# Patient Record
Sex: Female | Born: 1970
Health system: Southern US, Community
[De-identification: ages and names within clinical notes are randomized; demographics above are authoritative.]

## PROBLEM LIST (undated history)

## (undated) ENCOUNTER — Emergency Department (HOSPITAL_COMMUNITY): Discharge status: Absent without leave | Payer: Self-pay

---

## 2006-03-15 HISTORY — PX: BREAST EXCISIONAL BIOPSY: SUR124

## 2008-07-19 ENCOUNTER — Ambulatory Visit: Payer: Self-pay | Admitting: Internal Medicine

## 2008-07-19 ENCOUNTER — Emergency Department: Payer: Self-pay | Admitting: Unknown Physician Specialty

## 2009-05-26 ENCOUNTER — Ambulatory Visit: Payer: Self-pay | Admitting: Family Medicine

## 2009-06-13 ENCOUNTER — Ambulatory Visit: Payer: Self-pay | Admitting: Family Medicine

## 2009-07-08 ENCOUNTER — Ambulatory Visit: Payer: Self-pay | Admitting: Surgery

## 2009-07-09 ENCOUNTER — Ambulatory Visit: Payer: Self-pay | Admitting: Surgery

## 2009-07-10 ENCOUNTER — Ambulatory Visit: Payer: Self-pay | Admitting: Surgery

## 2009-12-04 ENCOUNTER — Emergency Department: Payer: Self-pay | Admitting: Emergency Medicine

## 2010-03-11 ENCOUNTER — Ambulatory Visit: Payer: Self-pay | Admitting: Unknown Physician Specialty

## 2010-04-02 ENCOUNTER — Ambulatory Visit: Payer: Self-pay | Admitting: Unknown Physician Specialty

## 2010-04-03 ENCOUNTER — Ambulatory Visit: Payer: Self-pay | Admitting: Unknown Physician Specialty

## 2010-04-06 LAB — PATHOLOGY REPORT

## 2011-03-15 ENCOUNTER — Ambulatory Visit: Payer: Self-pay | Admitting: Internal Medicine

## 2012-02-07 ENCOUNTER — Ambulatory Visit: Payer: Self-pay | Admitting: Family Medicine

## 2012-03-01 ENCOUNTER — Ambulatory Visit: Payer: Self-pay | Admitting: Surgery

## 2015-02-28 ENCOUNTER — Other Ambulatory Visit: Payer: Self-pay | Admitting: Family Medicine

## 2015-02-28 DIAGNOSIS — N644 Mastodynia: Secondary | ICD-10-CM

## 2015-03-27 ENCOUNTER — Ambulatory Visit
Admission: RE | Admit: 2015-03-27 | Discharge: 2015-03-27 | Disposition: A | Discharge status: Home / Self Care | Payer: Commercial Managed Care - HMO | Source: Ambulatory Visit | Attending: Family Medicine | Admitting: Family Medicine

## 2015-03-27 DIAGNOSIS — N644 Mastodynia: Secondary | ICD-10-CM

## 2015-04-28 ENCOUNTER — Ambulatory Visit: Payer: Self-pay | Admitting: Physician Assistant

## 2015-04-28 ENCOUNTER — Encounter: Payer: Self-pay | Admitting: Physician Assistant

## 2015-04-28 VITALS — BP 120/80 | HR 78 | Temp 98.8°F

## 2015-04-28 DIAGNOSIS — J029 Acute pharyngitis, unspecified: Secondary | ICD-10-CM

## 2015-04-28 MED ORDER — OMEPRAZOLE 40 MG PO CPDR: 40.0000 mg | DELAYED_RELEASE_CAPSULE | Freq: Every day | ORAL | Status: DC

## 2015-04-28 MED ORDER — AMOXICILLIN 875 MG PO TABS: 875.0000 mg | ORAL_TABLET | Freq: Two times a day (BID) | ORAL | Status: DC

## 2015-04-28 NOTE — Progress Notes (Signed)
S: c/o sore throat for 7 days, no fever/chills, no cough or congestion, some fatigue, children haven't been sick, takes prilosec for reflux and sleeps on her left side, most of the pain is on the left side, also states her breath is bad  O: vitals wnl, nad, tms clear, nasal mucosa wnl, throat with red area posteriorly almost like a burn, area is raised but no exduate noted, neck supple no lymph, lungs c t a, cv rrr  A: acute pharyngitis, ?burn secondary to reflux vs other etiology  P: amoxil 838m bid x 10d, prilosec 44mqd, if not improving by the end of the week will refer to ENT or patient can also make her own appointment if desired

## 2016-04-19 DIAGNOSIS — L239 Allergic contact dermatitis, unspecified cause: Secondary | ICD-10-CM | POA: Diagnosis not present

## 2016-05-26 ENCOUNTER — Other Ambulatory Visit: Payer: Self-pay | Admitting: Internal Medicine

## 2016-05-26 DIAGNOSIS — Z1231 Encounter for screening mammogram for malignant neoplasm of breast: Secondary | ICD-10-CM

## 2016-06-25 ENCOUNTER — Ambulatory Visit
Admission: RE | Admit: 2016-06-25 | Discharge: 2016-06-25 | Disposition: A | Discharge status: Home / Self Care | Payer: Commercial Managed Care - HMO | Source: Ambulatory Visit | Attending: Internal Medicine | Admitting: Internal Medicine

## 2016-06-25 DIAGNOSIS — Z1231 Encounter for screening mammogram for malignant neoplasm of breast: Secondary | ICD-10-CM | POA: Diagnosis not present

## 2016-07-01 ENCOUNTER — Other Ambulatory Visit: Payer: Self-pay | Admitting: Internal Medicine

## 2016-07-01 DIAGNOSIS — R928 Other abnormal and inconclusive findings on diagnostic imaging of breast: Secondary | ICD-10-CM

## 2016-07-01 DIAGNOSIS — N6489 Other specified disorders of breast: Secondary | ICD-10-CM

## 2016-07-08 ENCOUNTER — Ambulatory Visit
Admission: RE | Admit: 2016-07-08 | Discharge: 2016-07-08 | Disposition: A | Discharge status: Home / Self Care | Payer: Commercial Managed Care - HMO | Source: Ambulatory Visit | Attending: Internal Medicine | Admitting: Internal Medicine

## 2016-07-08 DIAGNOSIS — N6323 Unspecified lump in the left breast, lower outer quadrant: Secondary | ICD-10-CM | POA: Diagnosis not present

## 2016-07-08 DIAGNOSIS — N6314 Unspecified lump in the right breast, lower inner quadrant: Secondary | ICD-10-CM | POA: Diagnosis not present

## 2016-07-08 DIAGNOSIS — R928 Other abnormal and inconclusive findings on diagnostic imaging of breast: Secondary | ICD-10-CM

## 2016-07-08 DIAGNOSIS — N6489 Other specified disorders of breast: Secondary | ICD-10-CM | POA: Diagnosis present

## 2016-07-08 DIAGNOSIS — N6324 Unspecified lump in the left breast, lower inner quadrant: Secondary | ICD-10-CM | POA: Diagnosis not present

## 2016-07-09 ENCOUNTER — Other Ambulatory Visit: Payer: Self-pay | Admitting: Internal Medicine

## 2016-07-09 DIAGNOSIS — N6489 Other specified disorders of breast: Secondary | ICD-10-CM

## 2016-07-21 DIAGNOSIS — L299 Pruritus, unspecified: Secondary | ICD-10-CM | POA: Diagnosis not present

## 2016-07-21 DIAGNOSIS — B850 Pediculosis due to Pediculus humanus capitis: Secondary | ICD-10-CM | POA: Diagnosis not present

## 2016-08-16 ENCOUNTER — Encounter: Payer: Self-pay | Admitting: Physician Assistant

## 2016-08-16 ENCOUNTER — Ambulatory Visit: Payer: Self-pay | Admitting: Physician Assistant

## 2016-08-16 VITALS — BP 100/74 | HR 85 | Temp 98.6°F

## 2016-08-16 DIAGNOSIS — A084 Viral intestinal infection, unspecified: Secondary | ICD-10-CM

## 2016-08-16 MED ORDER — ONDANSETRON 4 MG PO TBDP: 4.0000 mg | ORAL_TABLET | Freq: Three times a day (TID) | ORAL | 0 refills | Status: DC | PRN

## 2016-08-16 NOTE — Progress Notes (Signed)
S:  Pt c/o vomiting and diarrhea, sx for 1 day, no fever/chills, + abd pain with  cramping and diarrhea; no pain with walking,  denies cp/sob, denies camping, bad food, recent antibiotics, or exposure to bad water Remainder ros neg  O:  Vitals wnl, nad, ENT wnl, neck supple no lymph, lungs c t a, cv rrr, abd soft mildly tender in midepigastric and rlq,  bs increased lower quads b/l, neuro intact  A:  Viral gastroenteritis  P:  Reassurance, fluids, brat diet, immodium ad for diarrhea if needed, rx zofran, return if not better in 3 days, return earlier if worsening, if rlq pain is increasing then go to ER for eval

## 2016-09-07 ENCOUNTER — Encounter: Payer: Self-pay | Admitting: Physician Assistant

## 2016-09-07 ENCOUNTER — Ambulatory Visit: Payer: Self-pay | Admitting: Physician Assistant

## 2016-09-07 VITALS — BP 110/74 | HR 73 | Temp 98.9°F

## 2016-09-07 DIAGNOSIS — H6982 Other specified disorders of Eustachian tube, left ear: Secondary | ICD-10-CM

## 2016-09-07 MED ORDER — FLUTICASONE PROPIONATE 50 MCG/ACT NA SUSP: 2.0000 | Freq: Every day | NASAL | 6 refills | Status: AC

## 2016-09-07 NOTE — Progress Notes (Signed)
S:  C/o ears popping and being stopped up, no drainage from ears, some dizziness, had an episode of vertigo the other day, gets dizzy if she moves her head too quickly; no fever/chills, no cough or congestion, some sinus pressure, remainder ros neg Using otc meds without relief  O:  Vitals wnl, nad, tms dull b/l, nasal mucosa swollen and boggy, throat wnl, neck supple no lymph, lungs c t a, cv rrr, neuro intact  A: acute eustachean tube dysfunction  P: flonase, sudafed, return if not improving in 3 to 5 days, return earlier if worsening

## 2017-01-04 DIAGNOSIS — L718 Other rosacea: Secondary | ICD-10-CM | POA: Diagnosis not present

## 2017-07-27 ENCOUNTER — Other Ambulatory Visit: Payer: Self-pay | Admitting: Internal Medicine

## 2017-07-27 DIAGNOSIS — Z1239 Encounter for other screening for malignant neoplasm of breast: Secondary | ICD-10-CM

## 2017-07-27 DIAGNOSIS — N6310 Unspecified lump in the right breast, unspecified quadrant: Secondary | ICD-10-CM

## 2017-08-19 DIAGNOSIS — N926 Irregular menstruation, unspecified: Secondary | ICD-10-CM | POA: Diagnosis not present

## 2017-08-19 DIAGNOSIS — Z79899 Other long term (current) drug therapy: Secondary | ICD-10-CM | POA: Diagnosis not present

## 2017-08-19 DIAGNOSIS — Z13220 Encounter for screening for lipoid disorders: Secondary | ICD-10-CM | POA: Diagnosis not present

## 2017-09-02 DIAGNOSIS — K219 Gastro-esophageal reflux disease without esophagitis: Secondary | ICD-10-CM | POA: Diagnosis not present

## 2017-09-02 DIAGNOSIS — Z23 Encounter for immunization: Secondary | ICD-10-CM | POA: Diagnosis not present

## 2017-09-02 DIAGNOSIS — Z0000 Encounter for general adult medical examination without abnormal findings: Secondary | ICD-10-CM | POA: Diagnosis not present

## 2017-09-05 ENCOUNTER — Other Ambulatory Visit: Payer: Self-pay | Admitting: Internal Medicine

## 2017-09-05 DIAGNOSIS — R42 Dizziness and giddiness: Secondary | ICD-10-CM

## 2017-09-14 DIAGNOSIS — N912 Amenorrhea, unspecified: Secondary | ICD-10-CM | POA: Diagnosis not present

## 2017-09-14 DIAGNOSIS — Z124 Encounter for screening for malignant neoplasm of cervix: Secondary | ICD-10-CM | POA: Diagnosis not present

## 2017-09-14 DIAGNOSIS — N3281 Overactive bladder: Secondary | ICD-10-CM | POA: Diagnosis not present

## 2017-09-14 DIAGNOSIS — R790 Abnormal level of blood mineral: Secondary | ICD-10-CM | POA: Diagnosis not present

## 2017-09-16 ENCOUNTER — Ambulatory Visit
Admission: RE | Admit: 2017-09-16 | Discharge: 2017-09-16 | Disposition: A | Discharge status: Home / Self Care | Payer: 59 | Source: Ambulatory Visit | Attending: Internal Medicine | Admitting: Internal Medicine

## 2017-09-16 DIAGNOSIS — R42 Dizziness and giddiness: Secondary | ICD-10-CM | POA: Diagnosis not present

## 2017-09-28 DIAGNOSIS — Z1211 Encounter for screening for malignant neoplasm of colon: Secondary | ICD-10-CM | POA: Diagnosis not present

## 2017-10-04 DIAGNOSIS — N912 Amenorrhea, unspecified: Secondary | ICD-10-CM | POA: Diagnosis not present

## 2017-10-04 DIAGNOSIS — N3281 Overactive bladder: Secondary | ICD-10-CM | POA: Diagnosis not present

## 2017-10-12 DIAGNOSIS — N912 Amenorrhea, unspecified: Secondary | ICD-10-CM | POA: Diagnosis not present

## 2017-12-08 ENCOUNTER — Ambulatory Visit
Admission: RE | Admit: 2017-12-08 | Discharge: 2017-12-08 | Disposition: A | Discharge status: Home / Self Care | Payer: 59 | Source: Ambulatory Visit | Attending: Internal Medicine | Admitting: Internal Medicine

## 2017-12-08 DIAGNOSIS — Z1239 Encounter for other screening for malignant neoplasm of breast: Secondary | ICD-10-CM

## 2017-12-08 DIAGNOSIS — N6310 Unspecified lump in the right breast, unspecified quadrant: Secondary | ICD-10-CM | POA: Diagnosis not present

## 2017-12-08 DIAGNOSIS — R928 Other abnormal and inconclusive findings on diagnostic imaging of breast: Secondary | ICD-10-CM | POA: Diagnosis not present

## 2017-12-08 DIAGNOSIS — Z1231 Encounter for screening mammogram for malignant neoplasm of breast: Secondary | ICD-10-CM | POA: Insufficient documentation

## 2018-11-13 ENCOUNTER — Other Ambulatory Visit: Payer: Self-pay | Admitting: Internal Medicine

## 2018-11-13 DIAGNOSIS — Z1239 Encounter for other screening for malignant neoplasm of breast: Secondary | ICD-10-CM

## 2018-11-13 DIAGNOSIS — N6489 Other specified disorders of breast: Secondary | ICD-10-CM

## 2018-11-16 ENCOUNTER — Ambulatory Visit
Admission: RE | Admit: 2018-11-16 | Discharge: 2018-11-16 | Disposition: A | Discharge status: Home / Self Care | Payer: 59 | Source: Ambulatory Visit | Attending: Family Medicine | Admitting: Family Medicine

## 2018-11-16 ENCOUNTER — Other Ambulatory Visit: Payer: Self-pay

## 2018-11-16 ENCOUNTER — Other Ambulatory Visit: Payer: Self-pay | Admitting: Family Medicine

## 2018-11-16 DIAGNOSIS — R1011 Right upper quadrant pain: Secondary | ICD-10-CM

## 2018-11-16 DIAGNOSIS — R110 Nausea: Secondary | ICD-10-CM

## 2018-11-23 ENCOUNTER — Ambulatory Visit: Payer: 59

## 2018-12-14 ENCOUNTER — Ambulatory Visit
Admission: RE | Admit: 2018-12-14 | Discharge: 2018-12-14 | Disposition: A | Discharge status: Home / Self Care | Payer: 59 | Source: Ambulatory Visit | Attending: Internal Medicine | Admitting: Internal Medicine

## 2018-12-14 DIAGNOSIS — N6489 Other specified disorders of breast: Secondary | ICD-10-CM

## 2018-12-14 DIAGNOSIS — Z1239 Encounter for other screening for malignant neoplasm of breast: Secondary | ICD-10-CM

## 2018-12-18 ENCOUNTER — Other Ambulatory Visit: Payer: Self-pay | Admitting: Internal Medicine

## 2018-12-18 DIAGNOSIS — R928 Other abnormal and inconclusive findings on diagnostic imaging of breast: Secondary | ICD-10-CM

## 2018-12-18 DIAGNOSIS — N6320 Unspecified lump in the left breast, unspecified quadrant: Secondary | ICD-10-CM

## 2018-12-22 ENCOUNTER — Ambulatory Visit
Admission: RE | Admit: 2018-12-22 | Discharge: 2018-12-22 | Disposition: A | Discharge status: Home / Self Care | Payer: 59 | Source: Ambulatory Visit | Attending: Internal Medicine | Admitting: Internal Medicine

## 2018-12-22 DIAGNOSIS — N6320 Unspecified lump in the left breast, unspecified quadrant: Secondary | ICD-10-CM | POA: Insufficient documentation

## 2018-12-22 DIAGNOSIS — R928 Other abnormal and inconclusive findings on diagnostic imaging of breast: Secondary | ICD-10-CM | POA: Diagnosis present

## 2018-12-22 HISTORY — PX: BREAST BIOPSY: SHX20

## 2018-12-25 LAB — SURGICAL PATHOLOGY

## 2019-04-21 IMAGING — MR MR HEAD W/O CM
8 series · 48 of 48 positions shown · non-contrast
Comparison: Head CT 07/19/2008

CLINICAL DATA: Dizziness for a few months. Amenorrhea for 7 months.

EXAM:
MRI HEAD WITHOUT CONTRAST
TECHNIQUE: Multiplanar, multiecho pulse sequences of the brain and surrounding
structures were obtained without intravenous contrast.

[Series 2: T1 · sagittal · 5.0mm · 0.45mm/px · 2 of 27 slices shown (1 of 2)]
[im 1/27]
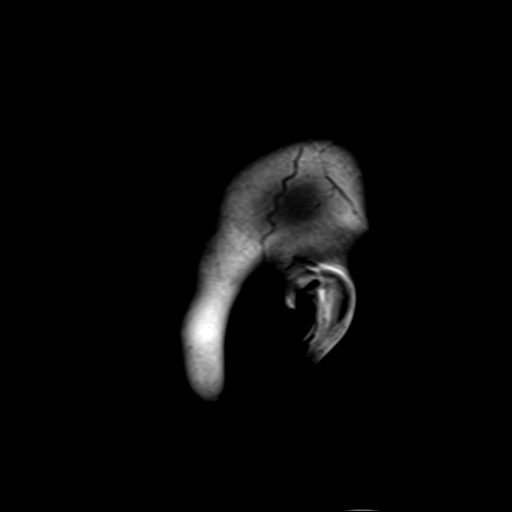
[im 27/27]
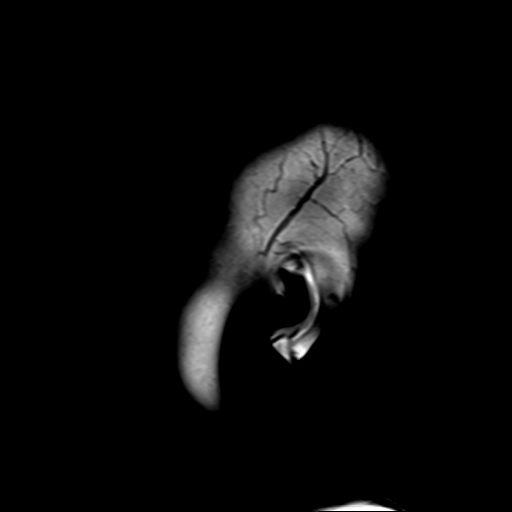

[Series 4: DWI · axial · 3.0mm · 1.80mm/px · z∈[-63,+98]mm · 6 of 55 slices shown]
[im 1/55]
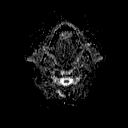
[im 11/55]
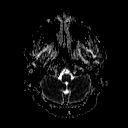
[im 22/55]
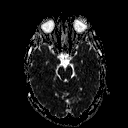
[im 33/55]
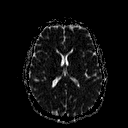
[im 44/55]
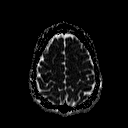
[im 55/55]
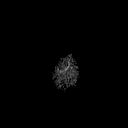

[Series 5: T2 · axial · 5.0mm · 0.60mm/px · z∈[-60,+95]mm · 3 of 25 slices shown (1 of 3)]
[im 1/25]
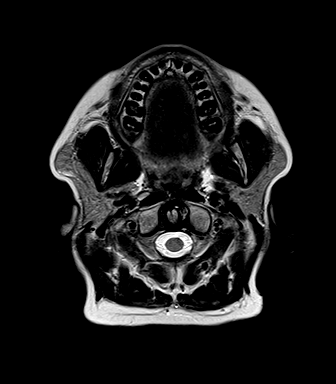
[im 13/25]
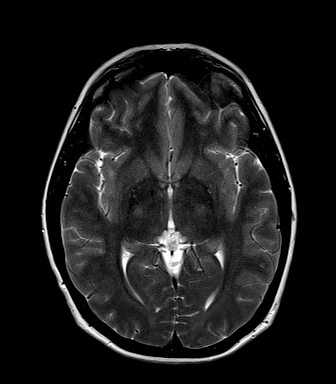
[im 25/25]
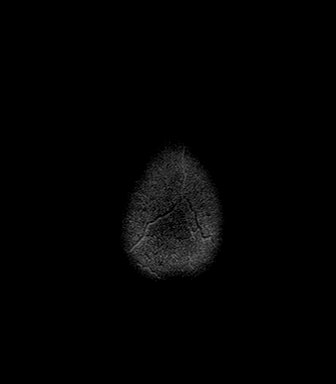

[Series 6: FLAIR · axial · 3.0mm · 0.45mm/px · z∈[-60,+95]mm · 6 of 53 slices shown]
[im 1/53]
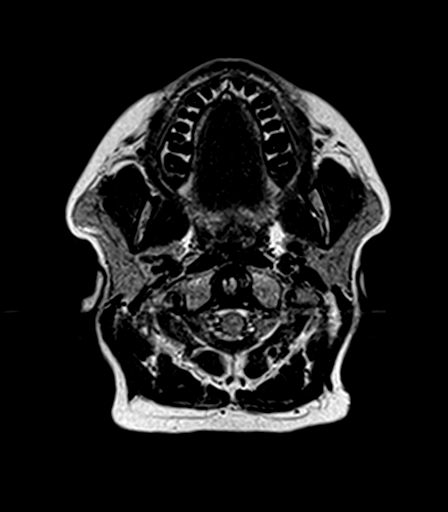
[im 11/53]
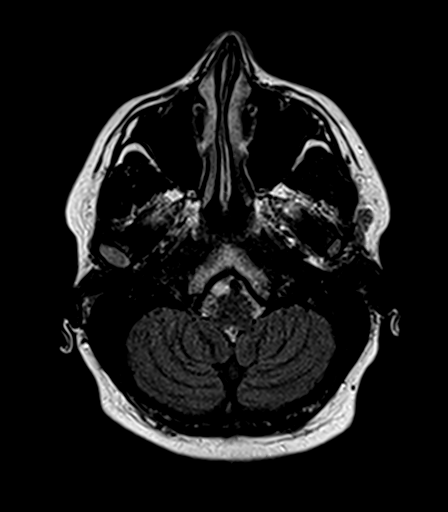
[im 21/53]
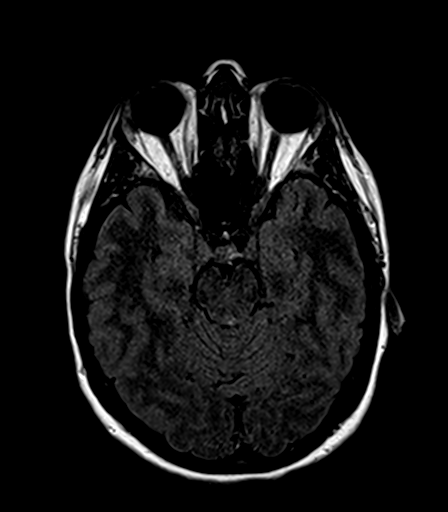
[im 32/53]
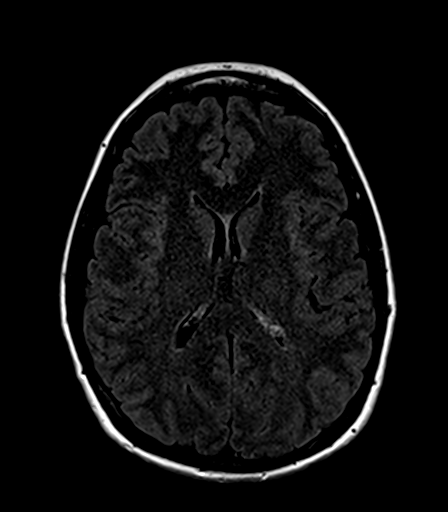
[im 42/53]
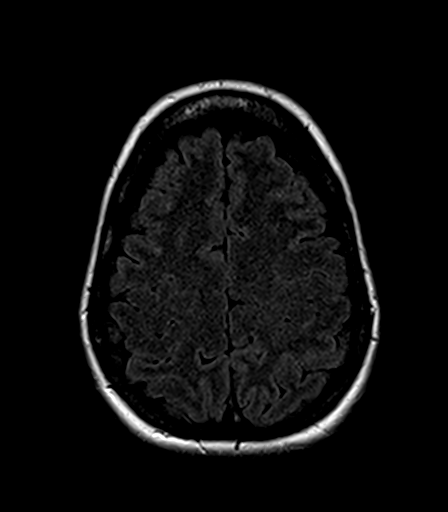
[im 53/53]
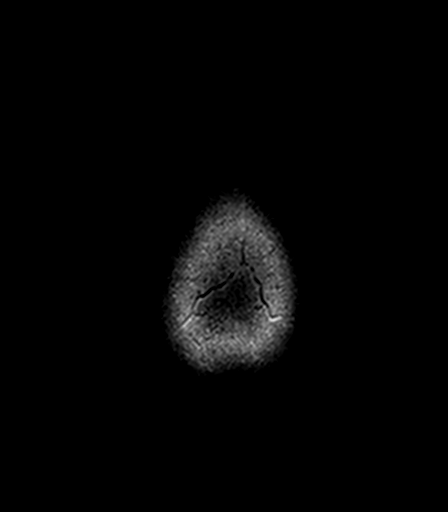

[Series 7: T2 · axial · 5.0mm · 0.45mm/px · z∈[-60,+95]mm · 3 of 25 slices shown (2 of 3)]
[im 1/25]
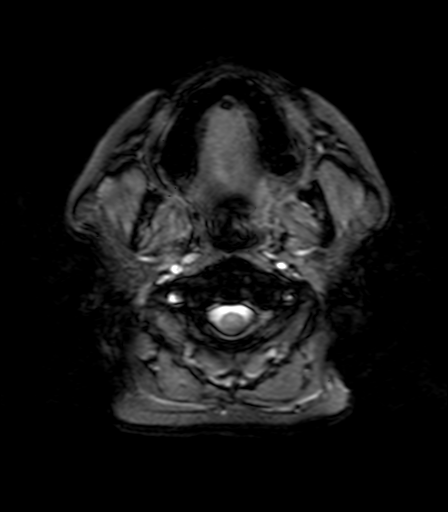
[im 13/25]
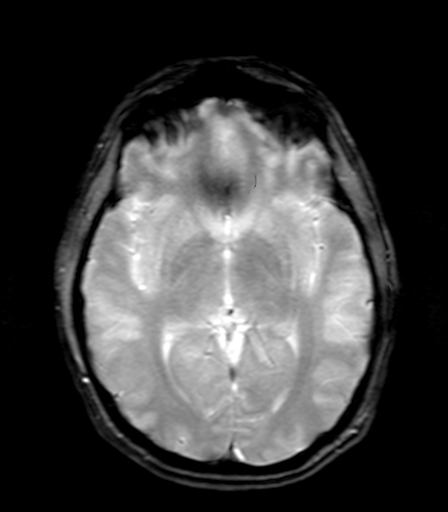
[im 25/25]
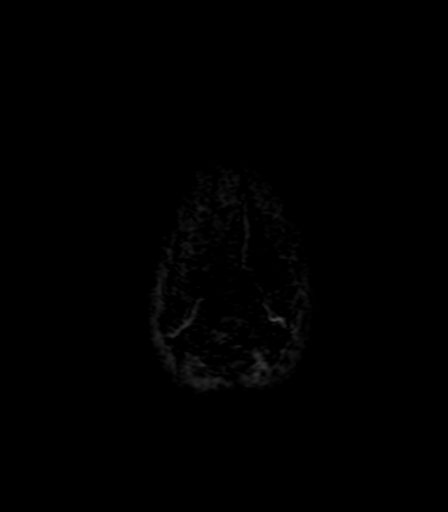

[Series 8: T1 · axial · 1.0mm · 1.00mm/px · z∈[-69,+106]mm · 19 of 176 slices shown (2 of 2)]
[im 1/176]
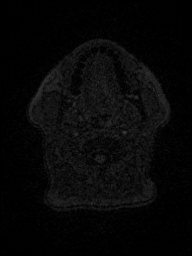
[im 10/176]
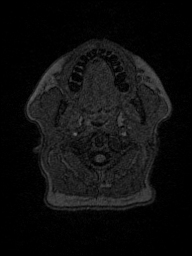
[im 20/176]
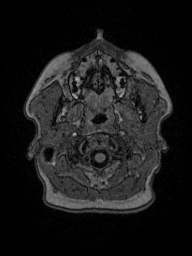
[im 30/176]
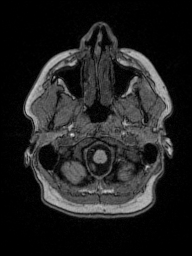
[im 39/176]
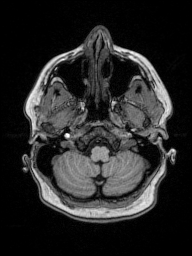
[im 49/176]
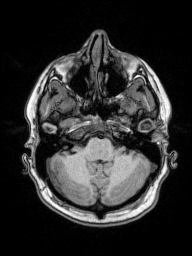
[im 59/176]
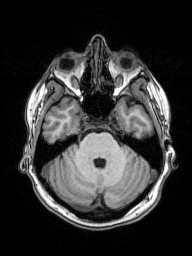
[im 69/176]
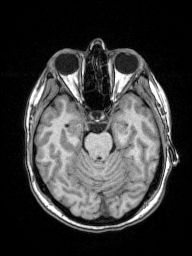
[im 78/176]
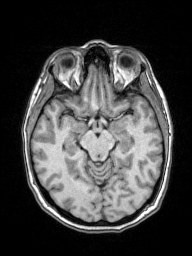
[im 88/176]
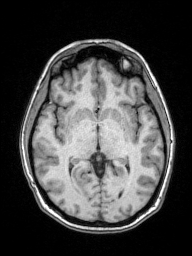
[im 98/176]
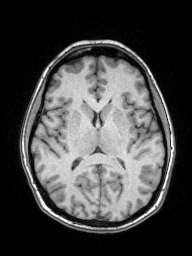
[im 107/176]
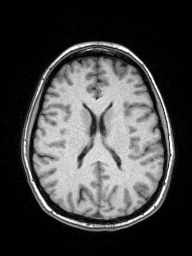
[im 117/176]
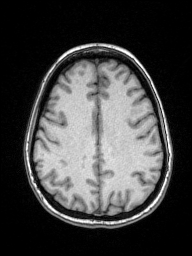
[im 127/176]
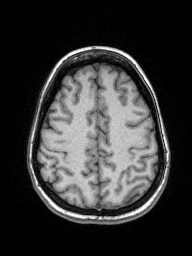
[im 137/176]
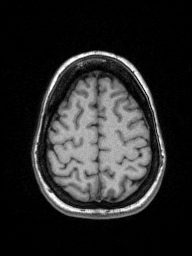
[im 146/176]
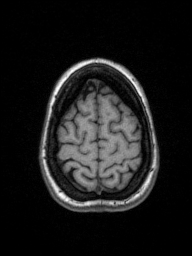
[im 156/176]
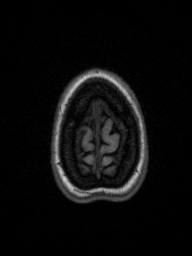
[im 166/176]
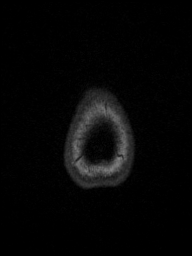
[im 176/176]
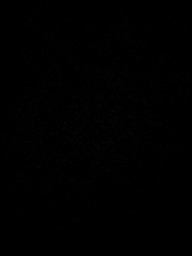

[Series 9: T2 · coronal · 5.0mm · 0.49mm/px · 3 of 29 slices shown (3 of 3)]
[im 1/29]
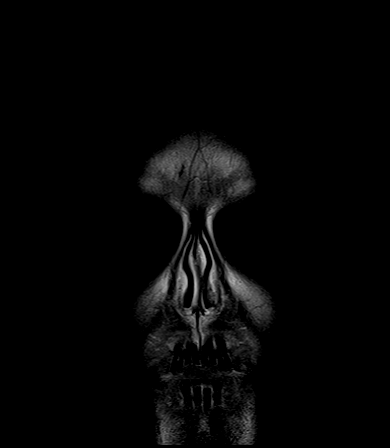
[im 15/29]
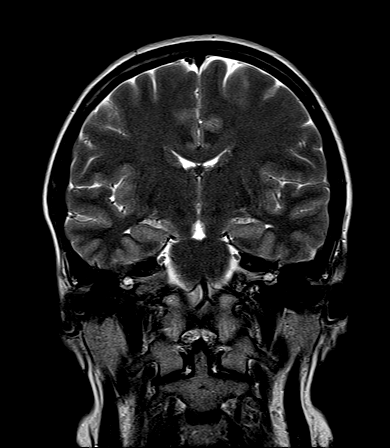
[im 29/29]
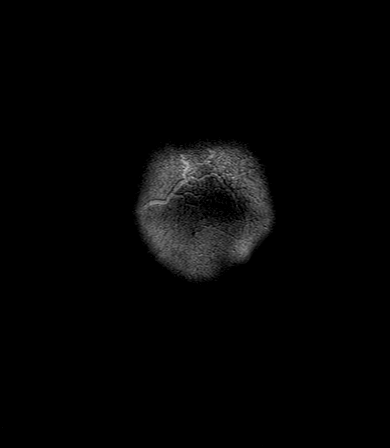

[Series 100: ax (id) · axial · 3.0mm · 1.80mm/px · z∈[-63,+98]mm · 6 of 55 slices shown]
[im 1/55]
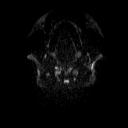
[im 11/55]
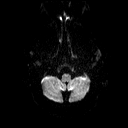
[im 22/55]
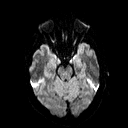
[im 33/55]
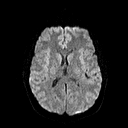
[im 44/55]
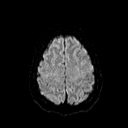
[im 55/55]
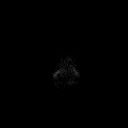

[48 of 48 positions shown; findings below may reference images not displayed]

FINDINGS: Brain: There is no evidence of acute infarct, intracranial
hemorrhage, mass, midline shift, or extra-axial fluid collection.
The ventricles and sulci are normal. There is a single 6 mm focus of
T2 hyperintensity in the left temporoparietal white matter lateral
to the atrium of the left lateral ventricle. The brainstem and
cerebellum are unremarkable.

Vascular: Major intracranial vascular flow voids are preserved.

Skull and upper cervical spine: Unremarkable bone marrow signal.

Sinuses/Orbits: Unremarkable orbits. Single opacified left ethmoid
air cell. Clear mastoid air cells.

Other: None.
IMPRESSION: 1. No acute intracranial abnormality or cause of dizziness
identified.
2. Single small focus of cerebral white matter T2 hyperintensity,
nonspecific and of doubtful clinical significance.

## 2019-05-16 ENCOUNTER — Ambulatory Visit: Payer: Self-pay | Attending: Internal Medicine

## 2019-05-16 DIAGNOSIS — Z23 Encounter for immunization: Secondary | ICD-10-CM | POA: Insufficient documentation

## 2019-05-16 NOTE — Progress Notes (Signed)
   Covid-19 Vaccination Clinic  Name:  Sue Santos    MRN: [id number] DOB: [birth year]  05/16/2019  [name] was observed post Covid-19 immunization for 15 minutes without incident. She was provided with Vaccine Information Sheet and instruction to access the V-Safe system.   [name] was instructed to call 911 with any severe reactions post vaccine: Marland Kitchen Difficulty breathing  . Swelling of face and throat  . A fast heartbeat  . A bad rash all over body  . Dizziness and weakness   Immunizations Administered    Name Date Dose VIS Date Route   Pfizer COVID-19 Vaccine 05/16/2019 12:04 PM 0.3 mL 02/23/2019 Intramuscular   Manufacturer: [place]   Lot: [id number]   [place]: [address]-6

## 2019-06-06 ENCOUNTER — Ambulatory Visit: Payer: Self-pay | Attending: Internal Medicine

## 2019-06-06 DIAGNOSIS — Z23 Encounter for immunization: Secondary | ICD-10-CM

## 2019-06-06 NOTE — Progress Notes (Signed)
   Covid-19 Vaccination Clinic  Name:  Sue Santos    MRN: [id number] DOB: 28-Jul-1970  06/06/2019  Sue Santos was observed post Covid-19 immunization for 15 minutes without incident. She was provided with Vaccine Information Sheet and instruction to access the V-Safe system.   Sue Santos was instructed to call 911 with any severe reactions post vaccine: Marland Kitchen Difficulty breathing  . Swelling of face and throat  . A fast heartbeat  . A bad rash all over body  . Dizziness and weakness   Immunizations Administered    Name Date Dose VIS Date Route   Pfizer COVID-19 Vaccine 06/06/2019  2:40 PM 0.3 mL 02/23/2019 Intramuscular   Manufacturer: [employer]   Lot: [id number]   [place]: [id number]

## 2019-07-24 ENCOUNTER — Other Ambulatory Visit: Payer: Self-pay | Admitting: Internal Medicine

## 2019-07-24 DIAGNOSIS — N6320 Unspecified lump in the left breast, unspecified quadrant: Secondary | ICD-10-CM

## 2019-08-22 ENCOUNTER — Ambulatory Visit
Admission: RE | Admit: 2019-08-22 | Discharge: 2019-08-22 | Disposition: A | Discharge status: Home / Self Care | Payer: 59 | Source: Ambulatory Visit | Attending: Internal Medicine | Admitting: Internal Medicine

## 2019-08-22 DIAGNOSIS — N6320 Unspecified lump in the left breast, unspecified quadrant: Secondary | ICD-10-CM

## 2019-08-22 DIAGNOSIS — R928 Other abnormal and inconclusive findings on diagnostic imaging of breast: Secondary | ICD-10-CM | POA: Diagnosis not present

## 2019-12-04 DIAGNOSIS — H524 Presbyopia: Secondary | ICD-10-CM | POA: Diagnosis not present

## 2019-12-10 ENCOUNTER — Other Ambulatory Visit: Payer: Self-pay | Admitting: Internal Medicine

## 2019-12-10 DIAGNOSIS — Z0000 Encounter for general adult medical examination without abnormal findings: Secondary | ICD-10-CM | POA: Diagnosis not present

## 2019-12-10 DIAGNOSIS — F3341 Major depressive disorder, recurrent, in partial remission: Secondary | ICD-10-CM | POA: Diagnosis not present

## 2019-12-10 DIAGNOSIS — K219 Gastro-esophageal reflux disease without esophagitis: Secondary | ICD-10-CM | POA: Diagnosis not present

## 2019-12-10 DIAGNOSIS — F418 Other specified anxiety disorders: Secondary | ICD-10-CM | POA: Diagnosis not present

## 2019-12-13 DIAGNOSIS — Z13220 Encounter for screening for lipoid disorders: Secondary | ICD-10-CM | POA: Diagnosis not present

## 2019-12-13 DIAGNOSIS — R1011 Right upper quadrant pain: Secondary | ICD-10-CM | POA: Diagnosis not present

## 2019-12-13 DIAGNOSIS — J029 Acute pharyngitis, unspecified: Secondary | ICD-10-CM | POA: Diagnosis not present

## 2019-12-13 DIAGNOSIS — J069 Acute upper respiratory infection, unspecified: Secondary | ICD-10-CM | POA: Diagnosis not present

## 2019-12-13 DIAGNOSIS — R110 Nausea: Secondary | ICD-10-CM | POA: Diagnosis not present

## 2019-12-13 DIAGNOSIS — N926 Irregular menstruation, unspecified: Secondary | ICD-10-CM | POA: Diagnosis not present

## 2019-12-13 DIAGNOSIS — Z79899 Other long term (current) drug therapy: Secondary | ICD-10-CM | POA: Diagnosis not present

## 2020-04-08 DIAGNOSIS — D2261 Melanocytic nevi of right upper limb, including shoulder: Secondary | ICD-10-CM | POA: Diagnosis not present

## 2020-04-08 DIAGNOSIS — L718 Other rosacea: Secondary | ICD-10-CM | POA: Diagnosis not present

## 2020-04-08 DIAGNOSIS — D225 Melanocytic nevi of trunk: Secondary | ICD-10-CM | POA: Diagnosis not present

## 2020-04-08 DIAGNOSIS — D2271 Melanocytic nevi of right lower limb, including hip: Secondary | ICD-10-CM | POA: Diagnosis not present

## 2020-04-08 DIAGNOSIS — D485 Neoplasm of uncertain behavior of skin: Secondary | ICD-10-CM | POA: Diagnosis not present

## 2020-04-08 DIAGNOSIS — D2272 Melanocytic nevi of left lower limb, including hip: Secondary | ICD-10-CM | POA: Diagnosis not present

## 2020-04-08 DIAGNOSIS — D2262 Melanocytic nevi of left upper limb, including shoulder: Secondary | ICD-10-CM | POA: Diagnosis not present

## 2020-04-08 DIAGNOSIS — L590 Erythema ab igne [dermatitis ab igne]: Secondary | ICD-10-CM | POA: Diagnosis not present

## 2020-04-08 DIAGNOSIS — L820 Inflamed seborrheic keratosis: Secondary | ICD-10-CM | POA: Diagnosis not present

## 2020-06-12 ENCOUNTER — Other Ambulatory Visit (HOSPITAL_COMMUNITY): Payer: Self-pay | Admitting: Physician Assistant

## 2020-06-19 ENCOUNTER — Other Ambulatory Visit: Payer: Self-pay | Admitting: Internal Medicine

## 2020-06-19 ENCOUNTER — Other Ambulatory Visit: Payer: Self-pay

## 2020-06-19 MED ORDER — CLONAZEPAM 0.5 MG PO TABS: ORAL_TABLET | ORAL | 5 refills | Status: DC

## 2020-06-19 MED FILL — Trazodone HCl Tab 50 MG: ORAL | 30 days supply | Qty: 30 | Fill #0 | Status: CN

## 2020-06-19 MED FILL — Doxycycline (Rosacea) Cap Delayed Release 40 MG: ORAL | 30 days supply | Qty: 30 | Fill #0 | Status: CN

## 2020-06-24 ENCOUNTER — Other Ambulatory Visit: Payer: Self-pay | Admitting: Internal Medicine

## 2020-06-24 ENCOUNTER — Other Ambulatory Visit: Payer: Self-pay

## 2020-06-24 DIAGNOSIS — J029 Acute pharyngitis, unspecified: Secondary | ICD-10-CM

## 2020-06-24 MED ORDER — CLONAZEPAM 0.5 MG PO TABS: ORAL_TABLET | ORAL | 5 refills | Status: DC

## 2020-06-24 MED ORDER — OMEPRAZOLE 40 MG PO CPDR
40.0000 mg | DELAYED_RELEASE_CAPSULE | Freq: Every day | ORAL | 3 refills | Status: DC
  Filled 2020-06-24 – 2020-07-04 (×2): qty 30, 30d supply, fill #0
  Filled 2020-08-28: qty 30, 30d supply, fill #1
  Filled 2020-11-21: qty 30, 30d supply, fill #2
  Filled 2021-01-28: qty 30, 30d supply, fill #3

## 2020-06-24 MED FILL — Doxycycline (Rosacea) Cap Delayed Release 40 MG: ORAL | 30 days supply | Qty: 30 | Fill #0 | Status: CN

## 2020-06-25 ENCOUNTER — Other Ambulatory Visit: Payer: Self-pay

## 2020-07-04 ENCOUNTER — Other Ambulatory Visit: Payer: Self-pay | Admitting: Internal Medicine

## 2020-07-04 ENCOUNTER — Other Ambulatory Visit: Payer: Self-pay

## 2020-07-04 MED ORDER — TRAZODONE HCL 50 MG PO TABS
ORAL_TABLET | ORAL | 5 refills | Status: AC
  Filled 2020-07-04 – 2021-01-28 (×2): qty 30, 30d supply, fill #0

## 2020-07-04 MED ORDER — CLONAZEPAM 0.5 MG PO TABS
ORAL_TABLET | ORAL | 5 refills | Status: DC
  Filled 2020-07-04: qty 60, 30d supply, fill #0
  Filled 2020-08-28: qty 60, 30d supply, fill #1
  Filled 2020-11-21: qty 60, 30d supply, fill #2

## 2020-07-04 MED FILL — Trazodone HCl Tab 50 MG: ORAL | 30 days supply | Qty: 30 | Fill #0 | Status: AC

## 2020-07-06 MED ORDER — CLONAZEPAM 0.5 MG PO TABS: ORAL_TABLET | ORAL | 5 refills | Status: DC

## 2020-07-25 DIAGNOSIS — Z20822 Contact with and (suspected) exposure to covid-19: Secondary | ICD-10-CM | POA: Diagnosis not present

## 2020-07-25 DIAGNOSIS — U071 COVID-19: Secondary | ICD-10-CM | POA: Diagnosis not present

## 2020-07-26 IMAGING — MG US BREAST BX W LOC DEV 1ST LESION IMG BX SPEC US GUIDE*L*
1 series · 8 of 8 positions shown · non-contrast
Comparison: Previous exam(s).
COMPARISON: Previous exam(s).

Addendum:
CLINICAL DATA: 9 mm mass in the 5 o'clock position of the left
breast at recent mammography and ultrasound.

EXAM:
ULTRASOUND GUIDED LEFT BREAST CORE NEEDLE BIOPSY

[Series 1: MG view · 0.07mm/px · 8 of 24 slices shown]
[im 1/24]
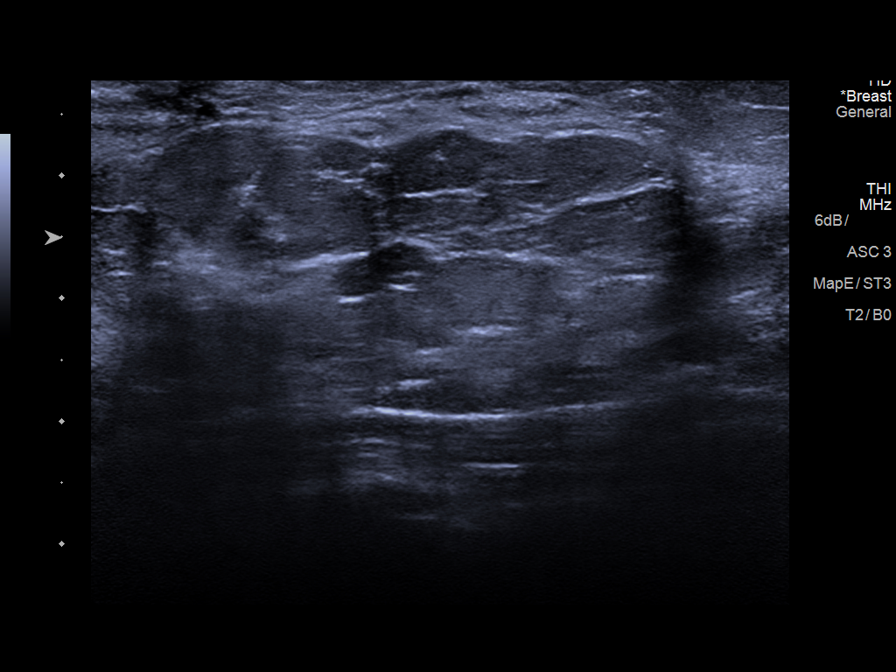
[im 4/24]
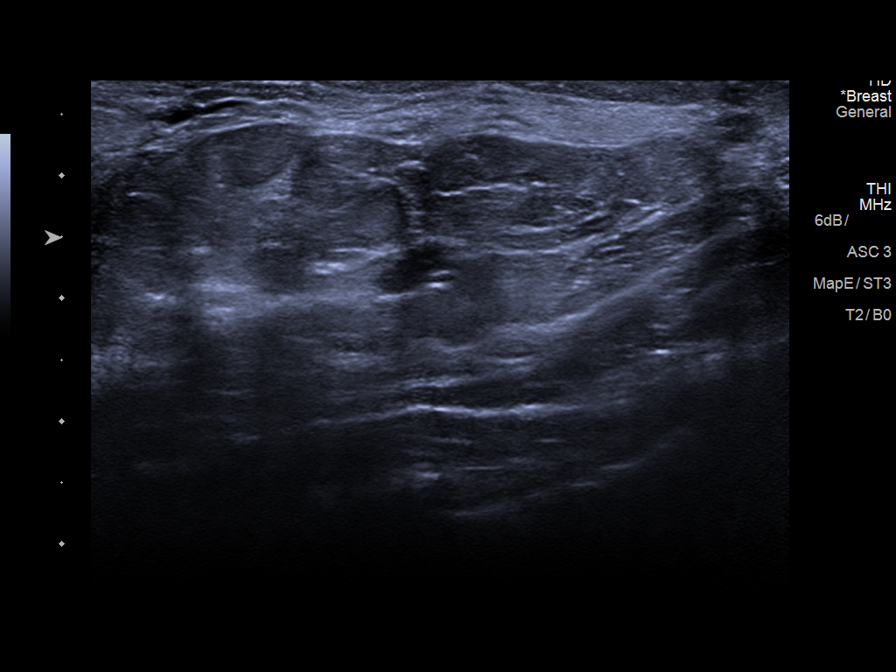
[im 7/24]
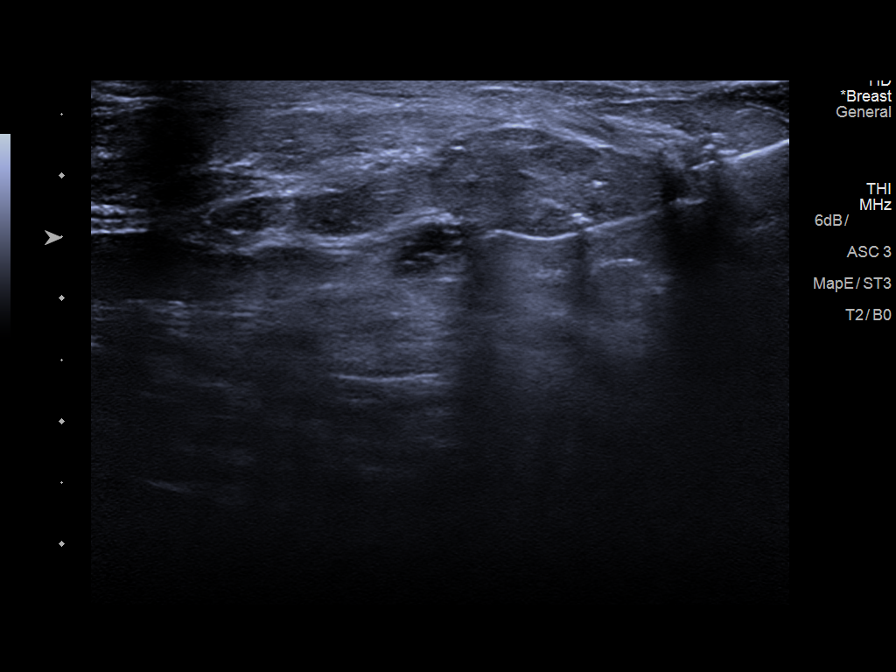
[im 10/24]
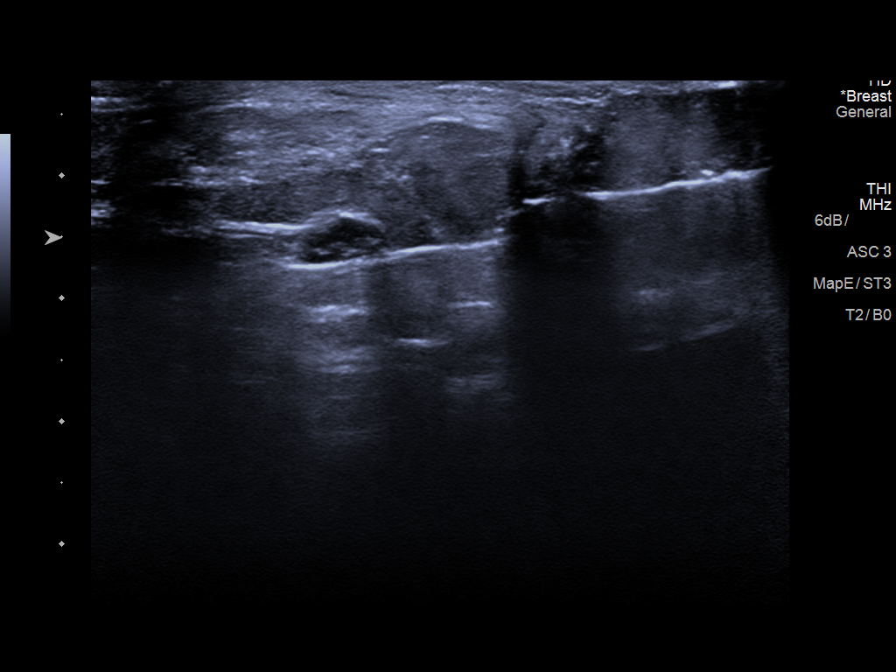
[im 14/24]
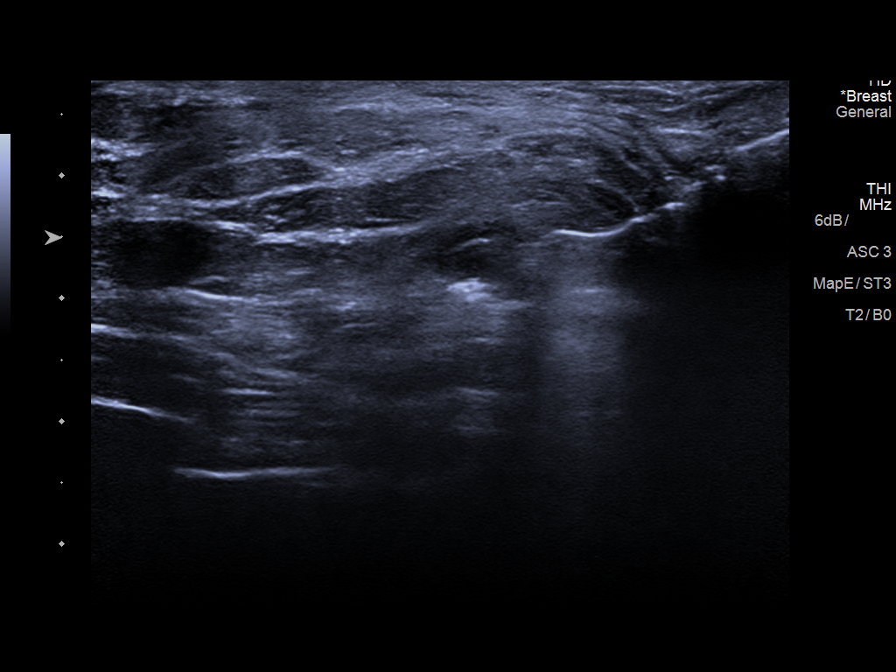
[im 17/24]
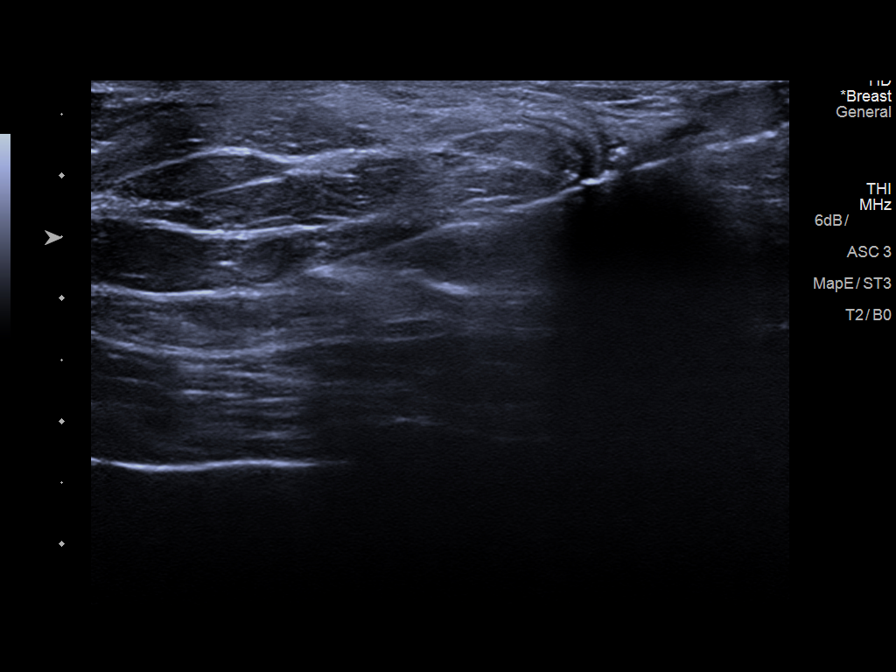
[im 20/24]
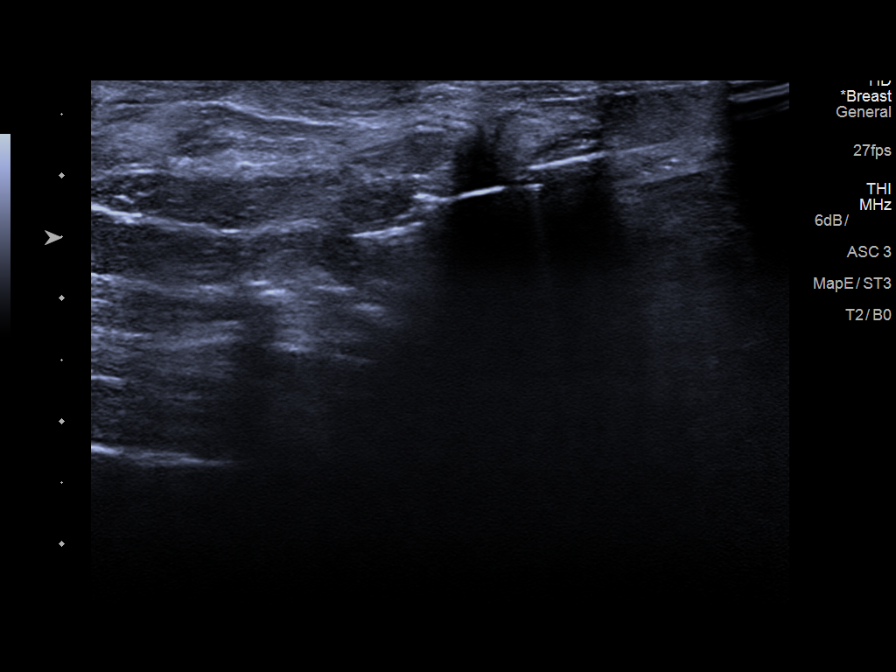
[im 24/24]
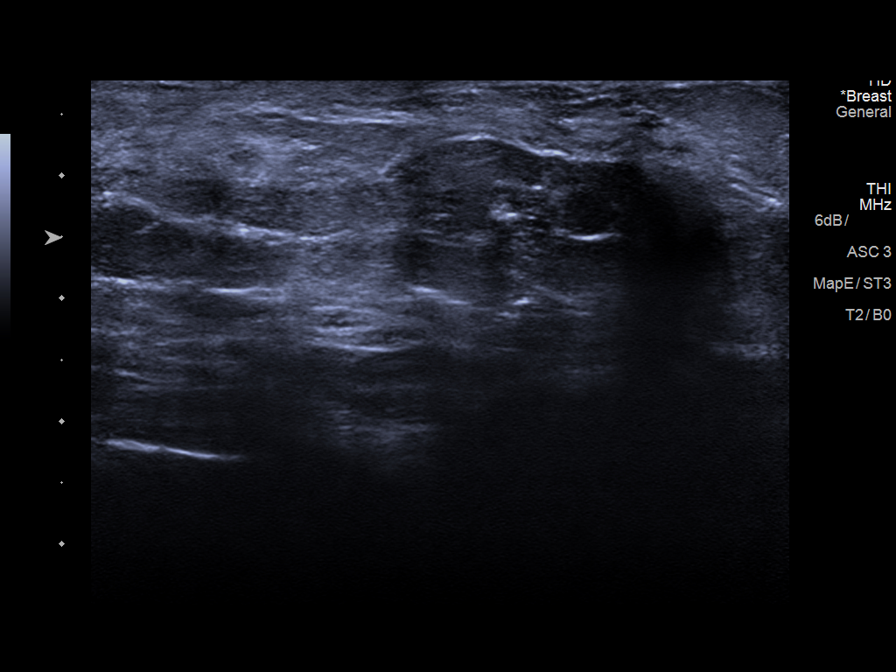

[8 of 8 positions shown; findings below may reference images not displayed]



Lesion quadrant: Lower outer quadrant

Using sterile technique and 1% Lidocaine as local anesthetic, under
direct ultrasound visualization, a 12 gauge Tuob device was
used to perform biopsy of the recently demonstrated 9 mm mass in the
5 o'clock position of the left breast using a inferolateral
approach. At the conclusion of the procedure heart shaped tissue
marker clip was deployed into the biopsy cavity. Follow up 2 view
mammogram was performed and dictated separately.
IMPRESSION: Ultrasound guided biopsy of the recently demonstrated 9 mm mass in
the 5 o'clock position of the left breast. No apparent
complications.

ADDENDUM:
PATHOLOGY revealed: A. BREAST, LEFT AT [DATE], 1 CM FROM THE NIPPLE; -
BENIGN MAMMARY PARENCHYMA WITH FIBROCYSTIC CHANGES AND APOCRINE
METAPLASIA. - NEGATIVE FOR ATYPICAL PROLIFERATIVE BREAST DISEASE.

Pathology results are CONCORDANT with imaging findings, per Dr.
Roberto Tiger.

Pathology results were discussed with patient via telephone. The
patient reported doing well after the biopsy with no adverse
symptoms, only tenderness at the site. Post biopsy care instructions
were reviewed and questions were answered. The patient was
encouraged to call [HOSPITAL] for any additional
questions or concerns.

The patient was instructed to return for LEFT diagnostic mammogram
and ultrasound in six months; informed a reminder notice would be
sent regarding this appointment.

Addendum by Satu-Maija Drufva RN on 12/26/2018.



Lesion quadrant: Lower outer quadrant

Using sterile technique and 1% Lidocaine as local anesthetic, under
direct ultrasound visualization, a 12 gauge Tuob device was
used to perform biopsy of the recently demonstrated 9 mm mass in the
5 o'clock position of the left breast using a inferolateral
approach. At the conclusion of the procedure heart shaped tissue
marker clip was deployed into the biopsy cavity. Follow up 2 view
mammogram was performed and dictated separately.
IMPRESSION: Ultrasound guided biopsy of the recently demonstrated 9 mm mass in
the 5 o'clock position of the left breast. No apparent
complications.

## 2020-08-28 ENCOUNTER — Other Ambulatory Visit: Payer: Self-pay

## 2020-08-28 MED FILL — Trazodone HCl Tab 50 MG: ORAL | 30 days supply | Qty: 30 | Fill #1 | Status: AC

## 2020-11-21 ENCOUNTER — Other Ambulatory Visit: Payer: Self-pay

## 2020-11-21 MED FILL — Trazodone HCl Tab 50 MG: ORAL | 30 days supply | Qty: 30 | Fill #2 | Status: AC

## 2021-01-21 ENCOUNTER — Other Ambulatory Visit: Payer: Self-pay | Admitting: Internal Medicine

## 2021-01-21 DIAGNOSIS — Z1231 Encounter for screening mammogram for malignant neoplasm of breast: Secondary | ICD-10-CM

## 2021-01-28 ENCOUNTER — Other Ambulatory Visit: Payer: Self-pay

## 2021-01-29 ENCOUNTER — Other Ambulatory Visit: Payer: Self-pay

## 2021-01-29 MED ORDER — CLONAZEPAM 0.5 MG PO TABS
ORAL_TABLET | ORAL | 5 refills | Status: DC
  Filled 2021-01-29: qty 60, 30d supply, fill #0

## 2021-02-01 DIAGNOSIS — J101 Influenza due to other identified influenza virus with other respiratory manifestations: Secondary | ICD-10-CM | POA: Diagnosis not present

## 2021-02-01 DIAGNOSIS — R059 Cough, unspecified: Secondary | ICD-10-CM | POA: Diagnosis not present

## 2021-03-23 ENCOUNTER — Other Ambulatory Visit: Payer: Self-pay

## 2021-03-23 ENCOUNTER — Ambulatory Visit
Admission: RE | Admit: 2021-03-23 | Discharge: 2021-03-23 | Disposition: A | Discharge status: Home / Self Care | Payer: 59 | Source: Ambulatory Visit | Attending: Internal Medicine | Admitting: Internal Medicine

## 2021-03-23 DIAGNOSIS — Z1231 Encounter for screening mammogram for malignant neoplasm of breast: Secondary | ICD-10-CM | POA: Diagnosis not present

## 2021-03-24 ENCOUNTER — Other Ambulatory Visit: Payer: Self-pay

## 2021-03-24 DIAGNOSIS — E538 Deficiency of other specified B group vitamins: Secondary | ICD-10-CM | POA: Diagnosis not present

## 2021-03-24 DIAGNOSIS — K219 Gastro-esophageal reflux disease without esophagitis: Secondary | ICD-10-CM | POA: Diagnosis not present

## 2021-03-24 DIAGNOSIS — F418 Other specified anxiety disorders: Secondary | ICD-10-CM | POA: Diagnosis not present

## 2021-03-24 DIAGNOSIS — Z0000 Encounter for general adult medical examination without abnormal findings: Secondary | ICD-10-CM | POA: Diagnosis not present

## 2021-03-24 MED ORDER — TRAZODONE HCL 50 MG PO TABS
50.0000 mg | ORAL_TABLET | Freq: Every day | ORAL | 5 refills | Status: DC
  Filled 2021-03-24: qty 30, 30d supply, fill #0
  Filled 2021-05-20: qty 30, 30d supply, fill #1
  Filled 2021-07-07: qty 30, 30d supply, fill #2
  Filled 2021-08-06: qty 30, 30d supply, fill #3
  Filled 2021-10-07: qty 30, 30d supply, fill #4
  Filled 2021-11-23: qty 30, 30d supply, fill #5

## 2021-03-24 MED ORDER — OMEPRAZOLE 40 MG PO CPDR
DELAYED_RELEASE_CAPSULE | ORAL | 3 refills | Status: DC
  Filled 2021-03-24: qty 90, 90d supply, fill #0
  Filled 2021-08-06: qty 90, 90d supply, fill #1
  Filled 2022-03-18: qty 90, 90d supply, fill #2

## 2021-03-24 MED ORDER — CLONAZEPAM 0.5 MG PO TABS
ORAL_TABLET | ORAL | 5 refills | Status: DC
  Filled 2021-03-24: qty 60, 30d supply, fill #0
  Filled 2021-05-20: qty 60, 30d supply, fill #1
  Filled 2021-08-06: qty 60, 30d supply, fill #2

## 2021-03-25 ENCOUNTER — Other Ambulatory Visit: Payer: Self-pay

## 2021-03-31 DIAGNOSIS — Z1211 Encounter for screening for malignant neoplasm of colon: Secondary | ICD-10-CM | POA: Diagnosis not present

## 2021-04-09 DIAGNOSIS — N926 Irregular menstruation, unspecified: Secondary | ICD-10-CM | POA: Diagnosis not present

## 2021-04-09 DIAGNOSIS — Z13220 Encounter for screening for lipoid disorders: Secondary | ICD-10-CM | POA: Diagnosis not present

## 2021-04-09 DIAGNOSIS — Z79899 Other long term (current) drug therapy: Secondary | ICD-10-CM | POA: Diagnosis not present

## 2021-04-09 DIAGNOSIS — E538 Deficiency of other specified B group vitamins: Secondary | ICD-10-CM | POA: Diagnosis not present

## 2021-04-09 LAB — COLOGUARD: COLOGUARD: NEGATIVE

## 2021-05-05 ENCOUNTER — Other Ambulatory Visit: Payer: Self-pay

## 2021-05-05 MED ORDER — CITALOPRAM HYDROBROMIDE 20 MG PO TABS
ORAL_TABLET | ORAL | 2 refills | Status: DC
  Filled 2021-05-05: qty 30, 30d supply, fill #0
  Filled 2021-06-05: qty 30, 30d supply, fill #1
  Filled 2021-07-07: qty 30, 30d supply, fill #2

## 2021-05-20 ENCOUNTER — Other Ambulatory Visit: Payer: Self-pay

## 2021-05-27 DIAGNOSIS — H524 Presbyopia: Secondary | ICD-10-CM | POA: Diagnosis not present

## 2021-06-05 ENCOUNTER — Other Ambulatory Visit: Payer: Self-pay

## 2021-07-07 ENCOUNTER — Other Ambulatory Visit: Payer: Self-pay

## 2021-08-06 ENCOUNTER — Other Ambulatory Visit: Payer: Self-pay

## 2021-08-07 ENCOUNTER — Other Ambulatory Visit: Payer: Self-pay

## 2021-08-07 MED ORDER — CITALOPRAM HYDROBROMIDE 20 MG PO TABS
ORAL_TABLET | ORAL | 2 refills | Status: DC
  Filled 2021-08-07: qty 30, 30d supply, fill #0
  Filled 2021-09-07: qty 30, 30d supply, fill #1
  Filled 2021-10-07: qty 30, 30d supply, fill #2

## 2021-09-07 ENCOUNTER — Other Ambulatory Visit: Payer: Self-pay

## 2021-10-07 ENCOUNTER — Other Ambulatory Visit: Payer: Self-pay

## 2021-10-07 MED ORDER — CLONAZEPAM 0.5 MG PO TABS
ORAL_TABLET | ORAL | 5 refills | Status: AC
  Filled 2021-10-07: qty 60, 30d supply, fill #0
  Filled 2021-11-23: qty 60, 30d supply, fill #1
  Filled 2022-03-18: qty 60, 30d supply, fill #2

## 2021-10-08 ENCOUNTER — Other Ambulatory Visit: Payer: Self-pay

## 2021-11-09 ENCOUNTER — Other Ambulatory Visit: Payer: Self-pay

## 2021-11-09 MED ORDER — CITALOPRAM HYDROBROMIDE 20 MG PO TABS
ORAL_TABLET | ORAL | 0 refills | Status: DC
  Filled 2021-11-09: qty 30, 30d supply, fill #0

## 2021-11-10 ENCOUNTER — Other Ambulatory Visit: Payer: Self-pay

## 2021-11-23 ENCOUNTER — Other Ambulatory Visit: Payer: Self-pay

## 2021-12-09 ENCOUNTER — Other Ambulatory Visit: Payer: Self-pay

## 2021-12-09 MED ORDER — CITALOPRAM HYDROBROMIDE 20 MG PO TABS
ORAL_TABLET | ORAL | 0 refills | Status: AC
  Filled 2021-12-09: qty 30, 30d supply, fill #0

## 2021-12-30 DIAGNOSIS — R233 Spontaneous ecchymoses: Secondary | ICD-10-CM | POA: Diagnosis not present

## 2021-12-30 DIAGNOSIS — Z79899 Other long term (current) drug therapy: Secondary | ICD-10-CM | POA: Diagnosis not present

## 2021-12-31 DIAGNOSIS — Z113 Encounter for screening for infections with a predominantly sexual mode of transmission: Secondary | ICD-10-CM | POA: Diagnosis not present

## 2021-12-31 DIAGNOSIS — R233 Spontaneous ecchymoses: Secondary | ICD-10-CM | POA: Diagnosis not present

## 2021-12-31 DIAGNOSIS — Z114 Encounter for screening for human immunodeficiency virus [HIV]: Secondary | ICD-10-CM | POA: Diagnosis not present

## 2021-12-31 DIAGNOSIS — R7989 Other specified abnormal findings of blood chemistry: Secondary | ICD-10-CM | POA: Diagnosis not present

## 2022-01-04 ENCOUNTER — Other Ambulatory Visit: Payer: Self-pay | Admitting: Physician Assistant

## 2022-01-04 DIAGNOSIS — R7989 Other specified abnormal findings of blood chemistry: Secondary | ICD-10-CM

## 2022-01-05 ENCOUNTER — Ambulatory Visit
Admission: RE | Admit: 2022-01-05 | Discharge: 2022-01-05 | Disposition: A | Discharge status: Home / Self Care | Payer: 59 | Source: Ambulatory Visit | Attending: Physician Assistant | Admitting: Physician Assistant

## 2022-01-05 DIAGNOSIS — R945 Abnormal results of liver function studies: Secondary | ICD-10-CM | POA: Diagnosis not present

## 2022-01-05 DIAGNOSIS — R233 Spontaneous ecchymoses: Secondary | ICD-10-CM | POA: Diagnosis not present

## 2022-01-05 DIAGNOSIS — R7989 Other specified abnormal findings of blood chemistry: Secondary | ICD-10-CM | POA: Diagnosis not present

## 2022-01-11 ENCOUNTER — Inpatient Hospital Stay: Payer: 59

## 2022-01-11 ENCOUNTER — Inpatient Hospital Stay: Payer: 59 | Admitting: Internal Medicine

## 2022-01-11 ENCOUNTER — Inpatient Hospital Stay: Payer: 59 | Attending: Internal Medicine | Admitting: Internal Medicine

## 2022-01-11 ENCOUNTER — Encounter: Payer: Self-pay | Admitting: Internal Medicine

## 2022-01-11 DIAGNOSIS — R7401 Elevation of levels of liver transaminase levels: Secondary | ICD-10-CM | POA: Diagnosis not present

## 2022-01-11 DIAGNOSIS — Z79899 Other long term (current) drug therapy: Secondary | ICD-10-CM | POA: Diagnosis not present

## 2022-01-11 DIAGNOSIS — E538 Deficiency of other specified B group vitamins: Secondary | ICD-10-CM | POA: Diagnosis not present

## 2022-01-11 DIAGNOSIS — Z79624 Long term (current) use of inhibitors of nucleotide synthesis: Secondary | ICD-10-CM | POA: Insufficient documentation

## 2022-01-11 DIAGNOSIS — N920 Excessive and frequent menstruation with regular cycle: Secondary | ICD-10-CM | POA: Insufficient documentation

## 2022-01-11 DIAGNOSIS — R233 Spontaneous ecchymoses: Secondary | ICD-10-CM | POA: Diagnosis not present

## 2022-01-11 NOTE — Progress Notes (Signed)
Sue Santos  [phone] [phone]  ID: Sue Santos OB: 09-23-1970  MR#: [id number]  AOZ#:[id number]  Patient Care Team: Idelle Crouch, MD as PCP - General (Internal Medicine)  REFERRING PROVIDER: Felipa Furnace, MD  REASON FOR REFERRAL: easy bruising  HPI: Sue Santos is a 51 y.o. female with past medical history of anxiety was referred to hematology for easy bruising.  Patient noted spontaneous and easy bruising on bilateral arms and legs about 3 months ago which has been progressively worsening.  She reports history of heavy menstrual period.  She had dental procedures such as bracing as a child with no bleeding issues.  She had 2 uncomplicated vaginal childbirth.  Denies postpartum hemorrhage.  She had tonsillectomy at age 35 with no issues.  She had sphincterotomy for hemorrhoidal bleed and breast biopsy without any bleeding complications.  She does not have family history from his father side. But denies any history of excessive bleeding on maternal side.  Except for bruising, she denies any other bleeding.  She otherwise has been feeling well.  She has been exercising, losing weight intentionally which has helped with her anxiety.  Takes OTC liver supplement which she has stopped.  She used to take ibuprofen 600 to 800 mg 3-4 times a week for headache but has stopped 2 months ago.  She takes whey protein and multivitamin Gummies.  Denies any other use of herbal supplements.  She was having orbital migraine for 1 year and was taking Excedrin but has significantly improved now is occasionally using it like once a month.  Medications were reviewed.  She is on Klonopin, trazodone, omeprazole, valacyclovir as needed.  She was started on citalopram about 3 months ago same time and bruising started.  REVIEW OF SYSTEMS:   ROS  As per HPI. Otherwise, a complete review of systems is negative.  PAST MEDICAL HISTORY: History reviewed.  No pertinent past medical history.  PAST SURGICAL HISTORY: Past Surgical History:  Procedure Laterality Date   BREAST BIOPSY Left 12/22/2018   [place] bx, heart clip, BENIGN MAMMARY PARENCHYMA WITH FIBROCYSTIC CHANGES AND APOCRINE METAPLASIA.   BREAST EXCISIONAL BIOPSY Right 2008    FAMILY HISTORY: Family History  Problem Relation Age of Onset   Breast cancer Neg Hx     HEALTH MAINTENANCE: Social History   Tobacco Use   Smoking status: Never   Smokeless tobacco: Never  Substance Use Topics   Alcohol use: Yes   Drug use: Never     No Known Allergies  Current Outpatient Medications  Medication Sig Dispense Refill   citalopram (CELEXA) 20 MG tablet Take 1 tablet (20 mg total) by mouth once daily. No more without appt 30 tablet 0   clonazePAM (KLONOPIN) 0.5 MG tablet Take 1 tablet (0.5 mg total) by mouth 2 (two) times daily as needed 60 tablet 5   fluticasone (FLONASE) 50 MCG/ACT nasal spray Place 2 sprays into both nostrils daily. 16 g 6   omeprazole (PRILOSEC) 40 MG capsule Take 1 capsule (40 mg total) by mouth once daily 90 capsule 3   traZODone (DESYREL) 50 MG tablet Take 1 tablet (50 mg total) by mouth nightly 30 tablet 5   valACYclovir (VALTREX) 500 MG tablet      No current facility-administered medications for this visit.    OBJECTIVE: Vitals:   01/11/22 1317  BP: 109/71  Pulse: 71  Resp: 20  Temp: 98.7 F (37.1 C)  SpO2: 99%     There is  no height or weight on file to calculate BMI.      General: Well-developed, well-nourished, no acute distress. Eyes: Pink conjunctiva, anicteric sclera. HEENT: Normocephalic, moist mucous membranes, clear oropharnyx. Lungs: Clear to auscultation bilaterally. Heart: Regular rate and rhythm. No rubs, murmurs, or gallops. Abdomen: Soft, nontender, nondistended. No organomegaly noted, normoactive bowel sounds. Musculoskeletal: No edema, cyanosis, or clubbing. Neuro: Alert, answering all questions appropriately. Cranial nerves  grossly intact. Skin: No rashes or petechiae noted. Psych: Normal affect. Lymphatics: No cervical, calvicular, axillary or inguinal LAD.  3-4 cm bruising on bilateral arms/legs  LAB RESULTS:  No results found for: "NA", "K", "CL", "CO2", "GLUCOSE", "BUN", "CREATININE", "CALCIUM", "PROT", "ALBUMIN", "AST", "ALT", "ALKPHOS", "BILITOT", "GFRNONAA", "GFRAA"  No results found for: "WBC", "NEUTROABS", "HGB", "HCT", "MCV", "PLT"  No results found for: "TIBC", "FERRITIN", "IRONPCTSAT"   STUDIES: US Abdomen Limited RUQ (LIVER/GB)  Result Date: 01/05/2022 CLINICAL DATA:  Elevated LFTs EXAM: ULTRASOUND ABDOMEN LIMITED RIGHT UPPER QUADRANT COMPARISON:  None Available. FINDINGS: Gallbladder: No gallstones or wall thickening visualized. No sonographic Murphy sign noted by sonographer. Common bile duct: Diameter: 2 mm Liver: No focal lesion identified. Within normal limits in parenchymal echogenicity. Portal vein is patent on color Doppler imaging with normal direction of blood flow towards the liver. Other: None. IMPRESSION: No cholelithiasis or sonographic evidence for acute cholecystitis. Electronically Signed   By: Lovey Newcomer M.D.   On: 01/05/2022 10:13    ASSESSMENT AND PLAN:   Sue Santos is a 51 y.o. female with pmh of anxiety was referred to hematology for easy bruising.  #Easy bruising  -Started 3 months ago on bilateral arms and legs.  Bruising has been progressive. -Patient had multiple surgical exposure including tonsillectomy, vaginal birth, sphincterotomy, breast biopsy, dental procedure without any bleeding complications.  Denies any family history of excessive bleeding.  -Labs reviewed from PCP.  Vitamin K, B6, iron panel, normal.  PT PTT normal.  -Medication list was reviewed.  Advised the patient to stop ibuprofen, liver supplement, citalopram and multivitamin Gummies with vitamin E.  I am suspecting medication induced easy bruising likely from citalopram which can cause  platelet dysfunction.  Per patient, it was started about the same time when her bruising started.  -My suspicion for von Willebrand disease is low.  I will follow-up with the patient in 4 weeks to reassess bruising.  If she continues to have symptoms, will consider checking at that time.  #Low B12 level -B12 356 from October 2023.  Continue with B12 supplement 2000 mcg daily  #Mild transaminitis - hepatitis panel neg - AFP normal. Not likely to be related to bruising.  - US liver from 01/05/22 normal   RTC in 1 month for MD visit.  Patient expressed understanding and was in agreement with this plan. She also understands that She can call clinic at any time with any questions, concerns, or complaints.   I spent a total of 45 minutes reviewing chart data, face-to-face evaluation with the patient, counseling and coordination of care as detailed above.  Jane Canary, MD   01/11/2022 1:53 PM

## 2022-02-01 ENCOUNTER — Other Ambulatory Visit: Payer: Self-pay

## 2022-02-01 DIAGNOSIS — B9689 Other specified bacterial agents as the cause of diseases classified elsewhere: Secondary | ICD-10-CM | POA: Diagnosis not present

## 2022-02-01 DIAGNOSIS — H66003 Acute suppurative otitis media without spontaneous rupture of ear drum, bilateral: Secondary | ICD-10-CM | POA: Diagnosis not present

## 2022-02-01 DIAGNOSIS — B3731 Acute candidiasis of vulva and vagina: Secondary | ICD-10-CM | POA: Diagnosis not present

## 2022-02-01 DIAGNOSIS — J019 Acute sinusitis, unspecified: Secondary | ICD-10-CM | POA: Diagnosis not present

## 2022-02-01 MED ORDER — AMOXICILLIN-POT CLAVULANATE 875-125 MG PO TABS
ORAL_TABLET | ORAL | 0 refills | Status: AC
  Filled 2022-02-01: qty 20, 10d supply, fill #0

## 2022-02-01 MED ORDER — FLUCONAZOLE 150 MG PO TABS
150.0000 mg | ORAL_TABLET | Freq: Once | ORAL | 0 refills | Status: AC
  Filled 2022-02-01: qty 1, 1d supply, fill #0

## 2022-02-01 MED ORDER — HYDROCODONE-ACETAMINOPHEN 5-325 MG PO TABS
ORAL_TABLET | ORAL | 0 refills | Status: DC
  Filled 2022-02-01: qty 12, 3d supply, fill #0

## 2022-02-01 MED ORDER — PREDNISONE 20 MG PO TABS
ORAL_TABLET | ORAL | 0 refills | Status: AC
  Filled 2022-02-01: qty 10, 5d supply, fill #0

## 2022-02-08 ENCOUNTER — Inpatient Hospital Stay: Payer: 59 | Admitting: Internal Medicine

## 2022-02-08 ENCOUNTER — Other Ambulatory Visit: Payer: Self-pay

## 2022-02-08 MED ORDER — CEPHALEXIN 500 MG PO CAPS
1000.0000 mg | ORAL_CAPSULE | Freq: Two times a day (BID) | ORAL | 0 refills | Status: AC
  Filled 2022-02-08: qty 28, 7d supply, fill #0

## 2022-02-08 MED ORDER — HYDROCODONE-ACETAMINOPHEN 5-325 MG PO TABS
ORAL_TABLET | ORAL | 0 refills | Status: AC
  Filled 2022-02-08: qty 42, 7d supply, fill #0

## 2022-02-08 MED ORDER — PROMETHAZINE HCL 25 MG PO TABS
ORAL_TABLET | ORAL | 1 refills | Status: AC
  Filled 2022-02-08: qty 10, 3d supply, fill #0

## 2022-02-12 HISTORY — PX: AUGMENTATION MAMMAPLASTY: SUR837

## 2022-02-15 ENCOUNTER — Other Ambulatory Visit: Payer: Self-pay

## 2022-02-15 MED ORDER — SULFAMETHOXAZOLE-TRIMETHOPRIM 800-160 MG PO TABS
1.0000 | ORAL_TABLET | Freq: Two times a day (BID) | ORAL | 0 refills | Status: AC
  Filled 2022-02-15: qty 20, 10d supply, fill #0

## 2022-02-19 ENCOUNTER — Other Ambulatory Visit: Payer: Self-pay

## 2022-02-19 MED ORDER — FLUCONAZOLE 150 MG PO TABS
150.0000 mg | ORAL_TABLET | Freq: Once | ORAL | 0 refills | Status: AC
  Filled 2022-02-19: qty 1, 1d supply, fill #0

## 2022-03-18 ENCOUNTER — Other Ambulatory Visit: Payer: Self-pay

## 2022-03-18 MED ORDER — VALACYCLOVIR HCL 500 MG PO TABS
500.0000 mg | ORAL_TABLET | Freq: Every day | ORAL | 0 refills | Status: AC
  Filled 2022-03-18: qty 90, 90d supply, fill #0

## 2022-03-25 DIAGNOSIS — K219 Gastro-esophageal reflux disease without esophagitis: Secondary | ICD-10-CM | POA: Diagnosis not present

## 2022-03-25 DIAGNOSIS — Z0000 Encounter for general adult medical examination without abnormal findings: Secondary | ICD-10-CM | POA: Diagnosis not present

## 2022-03-25 DIAGNOSIS — F418 Other specified anxiety disorders: Secondary | ICD-10-CM | POA: Diagnosis not present

## 2022-03-25 DIAGNOSIS — E538 Deficiency of other specified B group vitamins: Secondary | ICD-10-CM | POA: Diagnosis not present

## 2022-08-16 ENCOUNTER — Other Ambulatory Visit: Payer: Self-pay

## 2022-08-16 MED ORDER — NITROFURANTOIN MONOHYD MACRO 100 MG PO CAPS
100.0000 mg | ORAL_CAPSULE | Freq: Two times a day (BID) | ORAL | 0 refills | Status: AC
  Filled 2022-08-16: qty 14, 7d supply, fill #0

## 2022-08-26 ENCOUNTER — Other Ambulatory Visit: Payer: Self-pay

## 2022-08-27 ENCOUNTER — Other Ambulatory Visit: Payer: Self-pay

## 2022-08-27 MED ORDER — VALACYCLOVIR HCL 500 MG PO TABS
500.0000 mg | ORAL_TABLET | Freq: Every day | ORAL | 0 refills | Status: DC
  Filled 2022-08-27: qty 90, 90d supply, fill #0

## 2022-09-03 ENCOUNTER — Other Ambulatory Visit: Payer: Self-pay

## 2022-09-03 DIAGNOSIS — N760 Acute vaginitis: Secondary | ICD-10-CM | POA: Diagnosis not present

## 2022-09-03 DIAGNOSIS — L249 Irritant contact dermatitis, unspecified cause: Secondary | ICD-10-CM | POA: Diagnosis not present

## 2022-09-03 MED ORDER — DESONIDE 0.05 % EX CREA
1.0000 | TOPICAL_CREAM | Freq: Two times a day (BID) | CUTANEOUS | 0 refills | Status: AC
  Filled 2022-09-03: qty 15, 15d supply, fill #0
  Filled 2022-09-03: qty 15, 7d supply, fill #0

## 2022-09-03 MED ORDER — METRONIDAZOLE 500 MG PO TABS
500.0000 mg | ORAL_TABLET | Freq: Two times a day (BID) | ORAL | 0 refills | Status: AC
  Filled 2022-09-03: qty 14, 7d supply, fill #0

## 2022-09-03 MED ORDER — FLUCONAZOLE 150 MG PO TABS
150.0000 mg | ORAL_TABLET | ORAL | 0 refills | Status: DC
  Filled 2022-09-03: qty 2, 3d supply, fill #0

## 2022-09-21 ENCOUNTER — Other Ambulatory Visit: Payer: Self-pay

## 2022-10-12 DIAGNOSIS — N9489 Other specified conditions associated with female genital organs and menstrual cycle: Secondary | ICD-10-CM | POA: Diagnosis not present

## 2022-10-12 DIAGNOSIS — A599 Trichomoniasis, unspecified: Secondary | ICD-10-CM | POA: Diagnosis not present

## 2022-10-27 DIAGNOSIS — J069 Acute upper respiratory infection, unspecified: Secondary | ICD-10-CM | POA: Diagnosis not present

## 2022-10-27 DIAGNOSIS — R051 Acute cough: Secondary | ICD-10-CM | POA: Diagnosis not present

## 2022-10-27 DIAGNOSIS — Z202 Contact with and (suspected) exposure to infections with a predominantly sexual mode of transmission: Secondary | ICD-10-CM | POA: Diagnosis not present

## 2022-11-16 DIAGNOSIS — N949 Unspecified condition associated with female genital organs and menstrual cycle: Secondary | ICD-10-CM | POA: Diagnosis not present

## 2022-11-16 DIAGNOSIS — N760 Acute vaginitis: Secondary | ICD-10-CM | POA: Diagnosis not present

## 2022-11-29 ENCOUNTER — Other Ambulatory Visit: Payer: Self-pay | Admitting: Obstetrics and Gynecology

## 2022-11-29 DIAGNOSIS — Z124 Encounter for screening for malignant neoplasm of cervix: Secondary | ICD-10-CM | POA: Diagnosis not present

## 2022-11-29 DIAGNOSIS — R87610 Atypical squamous cells of undetermined significance on cytologic smear of cervix (ASC-US): Secondary | ICD-10-CM | POA: Diagnosis not present

## 2022-11-29 DIAGNOSIS — E663 Overweight: Secondary | ICD-10-CM | POA: Diagnosis not present

## 2022-11-29 DIAGNOSIS — Z1231 Encounter for screening mammogram for malignant neoplasm of breast: Secondary | ICD-10-CM

## 2022-11-29 DIAGNOSIS — Z01411 Encounter for gynecological examination (general) (routine) with abnormal findings: Secondary | ICD-10-CM | POA: Diagnosis not present

## 2022-12-06 ENCOUNTER — Other Ambulatory Visit: Payer: Self-pay

## 2022-12-07 ENCOUNTER — Other Ambulatory Visit: Payer: Self-pay

## 2022-12-07 MED ORDER — VALACYCLOVIR HCL 500 MG PO TABS
500.0000 mg | ORAL_TABLET | Freq: Every day | ORAL | 0 refills | Status: AC
  Filled 2022-12-07: qty 90, 90d supply, fill #0

## 2022-12-22 DIAGNOSIS — R87610 Atypical squamous cells of undetermined significance on cytologic smear of cervix (ASC-US): Secondary | ICD-10-CM | POA: Diagnosis not present

## 2022-12-22 DIAGNOSIS — R87810 Cervical high risk human papillomavirus (HPV) DNA test positive: Secondary | ICD-10-CM | POA: Diagnosis not present

## 2023-01-20 DIAGNOSIS — H524 Presbyopia: Secondary | ICD-10-CM | POA: Diagnosis not present

## 2023-01-31 ENCOUNTER — Other Ambulatory Visit: Payer: Self-pay

## 2023-01-31 MED ORDER — AZITHROMYCIN 250 MG PO TABS
ORAL_TABLET | ORAL | 0 refills | Status: AC
  Filled 2023-01-31: qty 6, 5d supply, fill #0

## 2023-02-22 DIAGNOSIS — N760 Acute vaginitis: Secondary | ICD-10-CM | POA: Diagnosis not present

## 2023-02-23 ENCOUNTER — Ambulatory Visit: Payer: Self-pay

## 2023-04-13 ENCOUNTER — Other Ambulatory Visit: Payer: Self-pay

## 2023-04-13 MED ORDER — PHENTERMINE HCL 37.5 MG PO TABS
37.5000 mg | ORAL_TABLET | Freq: Every morning | ORAL | 0 refills | Status: DC
Start: 2023-04-13 — End: 2023-05-06
  Filled 2023-04-13: qty 30, 30d supply, fill #0

## 2023-04-13 MED ORDER — PHENTERMINE HCL 37.5 MG PO TABS
37.5000 mg | ORAL_TABLET | ORAL | 0 refills | Status: AC
  Filled 2023-04-13 – 2023-05-16 (×3): qty 30, 30d supply, fill #0

## 2023-04-14 ENCOUNTER — Other Ambulatory Visit: Payer: Self-pay

## 2023-04-15 DIAGNOSIS — E663 Overweight: Secondary | ICD-10-CM | POA: Diagnosis not present

## 2023-04-18 ENCOUNTER — Other Ambulatory Visit: Payer: Self-pay

## 2023-04-19 ENCOUNTER — Other Ambulatory Visit: Payer: Self-pay

## 2023-04-19 MED ORDER — VALACYCLOVIR HCL 500 MG PO TABS
500.0000 mg | ORAL_TABLET | Freq: Every day | ORAL | 0 refills | Status: DC
  Filled 2023-04-19: qty 30, 30d supply, fill #0

## 2023-05-06 ENCOUNTER — Other Ambulatory Visit: Payer: Self-pay

## 2023-05-06 DIAGNOSIS — Z13220 Encounter for screening for lipoid disorders: Secondary | ICD-10-CM | POA: Diagnosis not present

## 2023-05-06 DIAGNOSIS — Z79899 Other long term (current) drug therapy: Secondary | ICD-10-CM | POA: Diagnosis not present

## 2023-05-06 DIAGNOSIS — K219 Gastro-esophageal reflux disease without esophagitis: Secondary | ICD-10-CM | POA: Diagnosis not present

## 2023-05-06 DIAGNOSIS — E538 Deficiency of other specified B group vitamins: Secondary | ICD-10-CM | POA: Diagnosis not present

## 2023-05-06 DIAGNOSIS — Z1231 Encounter for screening mammogram for malignant neoplasm of breast: Secondary | ICD-10-CM | POA: Diagnosis not present

## 2023-05-06 DIAGNOSIS — Z0000 Encounter for general adult medical examination without abnormal findings: Secondary | ICD-10-CM | POA: Diagnosis not present

## 2023-05-06 DIAGNOSIS — F418 Other specified anxiety disorders: Secondary | ICD-10-CM | POA: Diagnosis not present

## 2023-05-06 MED ORDER — CYANOCOBALAMIN 1000 MCG/ML IJ SOLN
1000.0000 ug | INTRAMUSCULAR | 1 refills | Status: AC
  Filled 2023-05-06: qty 6, 84d supply, fill #0
  Filled 2023-07-18: qty 6, 84d supply, fill #1
  Filled 2024-03-21: qty 6, 84d supply, fill #0

## 2023-05-06 MED ORDER — TOPIRAMATE 50 MG PO TABS
50.0000 mg | ORAL_TABLET | Freq: Every day | ORAL | 5 refills | Status: AC
  Filled 2023-05-06: qty 30, 30d supply, fill #0
  Filled 2023-06-07: qty 30, 30d supply, fill #1
  Filled 2023-07-18: qty 30, 30d supply, fill #2
  Filled 2023-09-14: qty 30, 30d supply, fill #3

## 2023-05-09 ENCOUNTER — Other Ambulatory Visit: Payer: Self-pay | Admitting: Internal Medicine

## 2023-05-09 DIAGNOSIS — Z1231 Encounter for screening mammogram for malignant neoplasm of breast: Secondary | ICD-10-CM

## 2023-05-16 ENCOUNTER — Other Ambulatory Visit: Payer: Self-pay

## 2023-05-27 ENCOUNTER — Other Ambulatory Visit: Payer: Self-pay

## 2023-05-27 ENCOUNTER — Ambulatory Visit
Admission: RE | Admit: 2023-05-27 | Discharge: 2023-05-27 | Disposition: A | Discharge status: Home / Self Care | Source: Ambulatory Visit | Attending: Internal Medicine | Admitting: Internal Medicine

## 2023-05-27 DIAGNOSIS — Z1231 Encounter for screening mammogram for malignant neoplasm of breast: Secondary | ICD-10-CM | POA: Diagnosis not present

## 2023-05-29 ENCOUNTER — Other Ambulatory Visit: Payer: Self-pay

## 2023-05-29 MED ORDER — CLONAZEPAM 0.5 MG PO TABS
0.5000 mg | ORAL_TABLET | Freq: Two times a day (BID) | ORAL | 5 refills | Status: AC | PRN
  Filled 2023-05-29: qty 60, 30d supply, fill #0
  Filled 2023-07-18: qty 60, 30d supply, fill #1

## 2023-06-07 ENCOUNTER — Other Ambulatory Visit: Payer: Self-pay

## 2023-06-07 MED ORDER — VALACYCLOVIR HCL 500 MG PO TABS
500.0000 mg | ORAL_TABLET | Freq: Every day | ORAL | 0 refills | Status: DC
  Filled 2023-06-07: qty 30, 30d supply, fill #0

## 2023-06-30 ENCOUNTER — Other Ambulatory Visit: Payer: Self-pay

## 2023-07-01 ENCOUNTER — Other Ambulatory Visit: Payer: Self-pay

## 2023-07-03 ENCOUNTER — Other Ambulatory Visit: Payer: Self-pay

## 2023-07-06 ENCOUNTER — Other Ambulatory Visit: Payer: Self-pay

## 2023-07-07 ENCOUNTER — Other Ambulatory Visit: Payer: Self-pay

## 2023-07-08 ENCOUNTER — Other Ambulatory Visit: Payer: Self-pay

## 2023-07-08 MED ORDER — PHENTERMINE HCL 37.5 MG PO TABS
37.5000 mg | ORAL_TABLET | Freq: Every morning | ORAL | 2 refills | Status: AC
  Filled 2023-07-08: qty 30, 30d supply, fill #0
  Filled 2023-09-14: qty 30, 30d supply, fill #1

## 2023-07-11 ENCOUNTER — Other Ambulatory Visit: Payer: Self-pay

## 2023-07-18 ENCOUNTER — Other Ambulatory Visit: Payer: Self-pay

## 2023-07-18 MED ORDER — VALACYCLOVIR HCL 500 MG PO TABS
500.0000 mg | ORAL_TABLET | Freq: Every day | ORAL | 0 refills | Status: DC
  Filled 2023-07-18: qty 30, 30d supply, fill #0

## 2023-09-02 ENCOUNTER — Other Ambulatory Visit: Payer: Self-pay

## 2023-09-02 MED ORDER — VALACYCLOVIR HCL 500 MG PO TABS
500.0000 mg | ORAL_TABLET | Freq: Every day | ORAL | 1 refills | Status: AC
  Filled 2023-09-02: qty 60, 60d supply, fill #0
  Filled 2023-09-14: qty 30, 30d supply, fill #0
  Filled 2024-02-07: qty 30, 30d supply, fill #1

## 2023-09-02 MED ORDER — OMEPRAZOLE 40 MG PO CPDR
40.0000 mg | DELAYED_RELEASE_CAPSULE | Freq: Every day | ORAL | 3 refills | Status: AC
  Filled 2023-09-02 – 2023-09-14 (×2): qty 90, 90d supply, fill #0
  Filled 2024-02-07: qty 90, 90d supply, fill #1

## 2023-09-02 MED ORDER — FLUCONAZOLE 150 MG PO TABS
150.0000 mg | ORAL_TABLET | Freq: Once | ORAL | 0 refills | Status: AC
  Filled 2023-09-02: qty 1, 1d supply, fill #0

## 2023-09-13 ENCOUNTER — Other Ambulatory Visit: Payer: Self-pay

## 2023-09-14 ENCOUNTER — Other Ambulatory Visit: Payer: Self-pay

## 2023-09-27 DIAGNOSIS — Z7251 High risk heterosexual behavior: Secondary | ICD-10-CM | POA: Diagnosis not present

## 2023-09-27 DIAGNOSIS — R350 Frequency of micturition: Secondary | ICD-10-CM | POA: Diagnosis not present

## 2023-10-21 ENCOUNTER — Other Ambulatory Visit: Payer: Self-pay

## 2023-10-21 MED ORDER — TOPIRAMATE 50 MG PO TABS
50.0000 mg | ORAL_TABLET | Freq: Every day | ORAL | 1 refills | Status: AC
  Filled 2023-10-21: qty 90, 90d supply, fill #0
  Filled 2024-02-07: qty 90, 90d supply, fill #1
  Filled 2024-03-21: qty 90, 90d supply, fill #0

## 2023-10-21 MED ORDER — "MONOJECT TB SYRINGE 27G X 1/2"" 1 ML MISC"
1 refills | Status: AC
  Filled 2024-03-21: qty 1, 30d supply, fill #0

## 2023-10-21 MED ORDER — VALACYCLOVIR HCL 500 MG PO TABS
500.0000 mg | ORAL_TABLET | Freq: Every day | ORAL | 3 refills | Status: AC
  Filled 2023-10-21 – 2024-03-21 (×2): qty 90, 90d supply, fill #0

## 2023-10-21 MED ORDER — METRONIDAZOLE 500 MG PO TABS
500.0000 mg | ORAL_TABLET | Freq: Two times a day (BID) | ORAL | 0 refills | Status: AC
  Filled 2023-10-21: qty 14, 7d supply, fill #0

## 2024-02-07 ENCOUNTER — Other Ambulatory Visit: Payer: Self-pay

## 2024-02-08 ENCOUNTER — Other Ambulatory Visit: Payer: Self-pay

## 2024-03-20 ENCOUNTER — Other Ambulatory Visit: Payer: Self-pay | Admitting: Internal Medicine

## 2024-03-20 DIAGNOSIS — Z1231 Encounter for screening mammogram for malignant neoplasm of breast: Secondary | ICD-10-CM

## 2024-03-21 ENCOUNTER — Other Ambulatory Visit (HOSPITAL_COMMUNITY): Payer: Self-pay

## 2024-03-21 ENCOUNTER — Other Ambulatory Visit: Payer: Self-pay

## 2024-06-01 ENCOUNTER — Encounter
